# Patient Record
Sex: Female | Born: 1995 | Hispanic: Yes | Marital: Single | Smoking: Never smoker
Health system: Southern US, Community
[De-identification: ages and names within clinical notes are randomized; demographics above are authoritative.]

## PROBLEM LIST (undated history)

## (undated) HISTORY — PX: OTHER SURGICAL HISTORY: SHX169

## (undated) HISTORY — PX: TONSILLECTOMY: SUR1361

## (undated) HISTORY — PX: ARTHROSCOPIC REPAIR ACL: SUR80

---

## 2018-04-25 ENCOUNTER — Other Ambulatory Visit: Payer: Self-pay

## 2018-04-25 ENCOUNTER — Ambulatory Visit (INDEPENDENT_AMBULATORY_CARE_PROVIDER_SITE_OTHER): Payer: 59 | Admitting: Obstetrics and Gynecology

## 2018-04-25 ENCOUNTER — Other Ambulatory Visit (HOSPITAL_COMMUNITY)
Admission: RE | Admit: 2018-04-25 | Discharge: 2018-04-25 | Disposition: A | Discharge status: Home / Self Care | Payer: 59 | Source: Ambulatory Visit | Attending: Obstetrics and Gynecology | Admitting: Obstetrics and Gynecology

## 2018-04-25 ENCOUNTER — Encounter: Payer: Self-pay | Admitting: Obstetrics and Gynecology

## 2018-04-25 VITALS — BP 108/70 | HR 66 | Resp 16 | Ht 63.0 in | Wt 144.6 lb

## 2018-04-25 DIAGNOSIS — Z01419 Encounter for gynecological examination (general) (routine) without abnormal findings: Secondary | ICD-10-CM | POA: Diagnosis present

## 2018-04-25 DIAGNOSIS — N760 Acute vaginitis: Secondary | ICD-10-CM

## 2018-04-25 DIAGNOSIS — Z113 Encounter for screening for infections with a predominantly sexual mode of transmission: Secondary | ICD-10-CM

## 2018-04-25 NOTE — Progress Notes (Signed)
23 y.o. G0P0000 Single Hispanic female here for annual exam.    Patient complaining of unusual vaginal discharge--no vaginal irritation, itching or odor for 3 months. Tried probiotic. Took abx and steroids in October, 2019.  Did have a yeast infection in the past.   Last sexually active in summer last year.  Last STD screening in February, 2019.   Wants general blood work.  Wants to be a Armed forces operational officer.  PCP: None   Patient's last menstrual period was 04/07/2018 (exact date).     Period Cycle (Days): 30 Period Duration (Days): 5 days Period Pattern: Regular Menstrual Flow: Moderate Menstrual Control: Tampon, Maxi pad Menstrual Control Change Freq (Hours): every 3 hours on heaviest day Dysmenorrhea: None     Sexually active: No. female The current method of family planning is abstinence.    Exercising: Yes.    jogging/running Smoker:  no  Health Maintenance: Pap:  Never History of abnormal Pap:  n/a MMG:  n/a Colonoscopy:  n/a BMD:   n/a  Result  n/ TDaP:  2019 Gardasil:   Yes, completed x 3 HIV:04/2017 NR per patient Hep C: 04/2017 Neg per patient Screening Labs:  ----   reports that she has never smoked. She has never used smokeless tobacco. She reports previous alcohol use. She reports that she does not use drugs.  History reviewed. No pertinent past medical history.  Past Surgical History:  Procedure Laterality Date  . ARTHROSCOPIC REPAIR ACL Right   . TONSILLECTOMY     done at age 67 yo  . windom extractions     Oct. 2019    No current outpatient medications on file.   No current facility-administered medications for this visit.     Family History  Problem Relation Age of Onset  . Hypertension Mother   . Thyroid disease Mother        hyperthyroid  . Diabetes Maternal Grandmother   . Hyperlipidemia Maternal Grandmother     Review of Systems  All other systems reviewed and are negative.   Exam:   BP 108/70 (BP Location: Right Arm, Patient  Position: Sitting, Cuff Size: Normal)   Pulse 66   Resp 16   Ht 5\' 3"  (1.6 m)   Wt 144 lb 9.6 oz (65.6 kg)   LMP 04/07/2018 (Exact Date)   BMI 25.61 kg/m     General appearance: alert, cooperative and appears stated age Head: Normocephalic, without obvious abnormality, atraumatic Neck: no adenopathy, supple, symmetrical, trachea midline and thyroid normal to inspection and palpation Lungs: clear to auscultation bilaterally Breasts: normal appearance, no masses or tenderness, No nipple retraction or dimpling, No nipple discharge or bleeding, No axillary or supraclavicular adenopathy Heart: regular rate and rhythm Abdomen: soft, non-tender; no masses, no organomegaly Extremities: extremities normal, atraumatic, no cyanosis or edema Skin: Skin color, texture, turgor normal. No rashes or lesions Lymph nodes: Cervical, supraclavicular, and axillary nodes normal. No abnormal inguinal nodes palpated Neurologic: Grossly normal  Pelvic: External genitalia:  no lesions              Urethra:  normal appearing urethra with no masses, tenderness or lesions              Bartholins and Skenes: normal                 Vagina: normal appearing vagina with normal color and discharge, no lesions              Cervix: no lesions  Pap taken: Yes.   Bimanual Exam:  Uterus:  normal size, contour, position, consistency, mobility, non-tender              Adnexa: no mass, fullness, tenderness              Chaperone was present for exam.  Assessment:   Well woman visit with normal exam. Vaginitis.  STD screening.  Plan: Mammogram screening. Recommended self breast awareness. Pap and HR HPV as above. Guidelines for Calcium, Vitamin D, regular exercise program including cardiovascular and weight bearing exercise. Declines contraception.  Routine labs. STD screening.  Vaginitis screening.  Follow up annually and prn.   After visit summary provided.

## 2018-04-25 NOTE — Patient Instructions (Signed)

## 2018-04-26 LAB — HEP, RPR, HIV PANEL
HIV Screen 4th Generation wRfx: NONREACTIVE
Hepatitis B Surface Ag: NEGATIVE
RPR Ser Ql: NONREACTIVE

## 2018-04-26 LAB — CBC
Hematocrit: 40.4 % (ref 34.0–46.6)
Hemoglobin: 13.7 g/dL (ref 11.1–15.9)
MCH: 30.2 pg (ref 26.6–33.0)
MCHC: 33.9 g/dL (ref 31.5–35.7)
MCV: 89 fL (ref 79–97)
Platelets: 272 10*3/uL (ref 150–450)
RBC: 4.53 x10E6/uL (ref 3.77–5.28)
RDW: 12.2 % (ref 11.7–15.4)
WBC: 6.6 10*3/uL (ref 3.4–10.8)

## 2018-04-26 LAB — COMPREHENSIVE METABOLIC PANEL
ALT: 10 IU/L (ref 0–32)
AST: 15 IU/L (ref 0–40)
Albumin/Globulin Ratio: 2 (ref 1.2–2.2)
Albumin: 4.7 g/dL (ref 3.9–5.0)
Alkaline Phosphatase: 95 IU/L (ref 39–117)
BUN/Creatinine Ratio: 20 (ref 9–23)
BUN: 14 mg/dL (ref 6–20)
Bilirubin Total: 0.6 mg/dL (ref 0.0–1.2)
CO2: 23 mmol/L (ref 20–29)
Calcium: 9.3 mg/dL (ref 8.7–10.2)
Chloride: 102 mmol/L (ref 96–106)
Creatinine, Ser: 0.69 mg/dL (ref 0.57–1.00)
GFR calc Af Amer: 144 mL/min/{1.73_m2} (ref >59)
GFR calc non Af Amer: 125 mL/min/{1.73_m2} (ref >59)
Globulin, Total: 2.3 g/dL (ref 1.5–4.5)
Glucose: 88 mg/dL (ref 65–99)
POTASSIUM: 4.2 mmol/L (ref 3.5–5.2)
Sodium: 140 mmol/L (ref 134–144)
TOTAL PROTEIN: 7 g/dL (ref 6.0–8.5)

## 2018-04-26 LAB — HEPATITIS C ANTIBODY: Hep C Virus Ab: <0.1 s/co ratio (ref 0.0–0.9)

## 2018-04-26 LAB — LIPID PANEL
Chol/HDL Ratio: 2.1 ratio (ref 0.0–4.4)
Cholesterol, Total: 131 mg/dL (ref 100–199)
HDL: 62 mg/dL (ref >39)
LDL Calculated: 53 mg/dL (ref 0–99)
Triglycerides: 79 mg/dL (ref 0–149)
VLDL CHOLESTEROL CAL: 16 mg/dL (ref 5–40)

## 2018-04-28 LAB — CYTOLOGY - PAP: Diagnosis: NEGATIVE

## 2018-04-29 LAB — NUSWAB VAGINITIS PLUS (VG+)
Candida albicans, NAA: NEGATIVE
Candida glabrata, NAA: NEGATIVE
Chlamydia trachomatis, NAA: NEGATIVE
Neisseria gonorrhoeae, NAA: NEGATIVE
Trich vag by NAA: NEGATIVE

## 2018-11-20 ENCOUNTER — Other Ambulatory Visit: Payer: Self-pay

## 2018-11-20 DIAGNOSIS — Z20822 Contact with and (suspected) exposure to covid-19: Secondary | ICD-10-CM

## 2018-11-22 LAB — NOVEL CORONAVIRUS, NAA: SARS-CoV-2, NAA: NOT DETECTED

## 2019-03-17 ENCOUNTER — Telehealth: Payer: Self-pay | Admitting: Obstetrics and Gynecology

## 2019-03-17 NOTE — Telephone Encounter (Signed)
Left message on voicemail to call and reschedule cancelled appointment. °

## 2019-05-01 ENCOUNTER — Ambulatory Visit: Payer: 59 | Admitting: Obstetrics and Gynecology

## 2019-08-24 ENCOUNTER — Telehealth: Payer: Self-pay | Admitting: Obstetrics and Gynecology

## 2019-08-24 NOTE — Telephone Encounter (Signed)
AEX 04/2018, next scheduled for 11/2019 with Dr Edward Jolly Normal breast exam on last AEX. No family or personal H/O breast cancer  Spoke with pt. Pt states having intermittent sharp, shooting, burning sensation in left breast. Pt states goes from left breast to midline then under breast x 4 days now. Pt denies any breast skin changes, nipple discharge, fever, chills, breast surgery to area or lumps. Pt states has not had Covid vaccine series. Pt states only drinking 1 cup of coffee per day and has not recently increased caffeine. Pt's LMP was 6/8-6/13 and was normal with normal flow. Pt denies any previous MMG.   Advised pt to have OV for further evaluation. Pt offered several earlier appts, but declines due to work schedule. Pt scheduled for 6/28 at 1130 am with Dr Edward Jolly. Pt agreeable and verbalizes understanding of date and time of appt. Pt advised to return call to office if symptoms worsen to be seen earlier. Pt agreeable.  Routing to Dr Edward Jolly for review.  Encounter closed.

## 2019-08-24 NOTE — Telephone Encounter (Signed)
Patient is having "sharp breast pain."

## 2019-08-27 NOTE — Progress Notes (Signed)
GYNECOLOGY  VISIT   HPI: 24 y.o.   Single  Hispanic  female   G0P0000 with Patient's last menstrual period was 08/11/2019 (exact date).   here for left breast pain x1week.   It is a burning pain that comes and goes.  Nothing makes it better or worse. She does not feel any lump.  She has not had her Covid vaccine.   She is doing some weight lifting, starting 1 month ago.   No injuries.   No FH of breast cancer.   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   GYNECOLOGIC HISTORY: Patient's last menstrual period was 08/11/2019 (exact date). Contraception: none Menopausal hormone therapy:  none Last mammogram:  n/a Last pap smear: 04-25-18 Neg        OB History    Gravida  0   Para  0   Term  0   Preterm  0   AB  0   Living  0     SAB  0   TAB  0   Ectopic  0   Multiple  0   Live Births  0              There are no problems to display for this patient.   No past medical history on file.  Past Surgical History:  Procedure Laterality Date  . ARTHROSCOPIC REPAIR ACL Right   . TONSILLECTOMY     done at age 89 yo  . windom extractions     Oct. 2019    No current outpatient medications on file.   No current facility-administered medications for this visit.     ALLERGIES: Latex  Family History  Problem Relation Age of Onset  . Hypertension Mother   . Thyroid disease Mother        hyperthyroid  . Diabetes Maternal Grandmother   . Hyperlipidemia Maternal Grandmother     Social History   Socioeconomic History  . Marital status: Single    Spouse name: Not on file  . Number of children: Not on file  . Years of education: Not on file  . Highest education level: Not on file  Occupational History  . Not on file  Tobacco Use  . Smoking status: Never Smoker  . Smokeless tobacco: Never Used  Vaping Use  . Vaping Use: Never used  Substance and Sexual Activity  . Alcohol use: Not Currently  . Drug use: Never  . Sexual activity: Not Currently     Birth control/protection: Abstinence  Other Topics Concern  . Not on file  Social History Narrative  . Not on file   Social Determinants of Health   Financial Resource Strain:   . Difficulty of Paying Living Expenses:   Food Insecurity:   . Worried About Charity fundraiser in the Last Year:   . Arboriculturist in the Last Year:   Transportation Needs:   . Film/video editor (Medical):   Marland Kitchen Lack of Transportation (Non-Medical):   Physical Activity:   . Days of Exercise per Week:   . Minutes of Exercise per Session:   Stress:   . Feeling of Stress :   Social Connections:   . Frequency of Communication with Friends and Family:   . Frequency of Social Gatherings with Friends and Family:   . Attends Religious Services:   . Active Member of Clubs or Organizations:   . Attends Archivist Meetings:   Marland Kitchen Marital Status:   Intimate Production manager  Violence:   . Fear of Current or Ex-Partner:   . Emotionally Abused:   Marland Kitchen Physically Abused:   . Sexually Abused:     Review of Systems  All other systems reviewed and are negative.   PHYSICAL EXAMINATION:    BP 98/62   Pulse 60   Temp (!) 97.2 F (36.2 C) (Temporal)   Ht 5\' 3"  (1.6 m)   Wt 146 lb (66.2 kg)   LMP 08/11/2019 (Exact Date)   BMI 25.86 kg/m     General appearance: alert, cooperative and appears stated age Head: Normocephalic, without obvious abnormality, atraumatic Breasts: normal appearance, no masses or tenderness, No nipple retraction or dimpling, No nipple discharge or bleeding, No axillary adenopathy   Chaperone was present for exam.  ASSESSMENT  Left breast pain.  Probable musculoskeletal strain.   PLAN  Stop weight lifting temporarily.  Ibuprofen 600 mg po q 6 hours prn.  Try heat.  Return for continued pain or if a mass is felt, would then do breast 10/11/2019.  Return also for annual exam.

## 2019-08-31 ENCOUNTER — Ambulatory Visit (INDEPENDENT_AMBULATORY_CARE_PROVIDER_SITE_OTHER): Payer: BC Managed Care – PPO | Admitting: Obstetrics and Gynecology

## 2019-08-31 ENCOUNTER — Encounter: Payer: Self-pay | Admitting: Obstetrics and Gynecology

## 2019-08-31 ENCOUNTER — Other Ambulatory Visit: Payer: Self-pay

## 2019-08-31 VITALS — BP 98/62 | HR 60 | Temp 97.2°F | Ht 63.0 in | Wt 146.0 lb

## 2019-08-31 DIAGNOSIS — N644 Mastodynia: Secondary | ICD-10-CM | POA: Diagnosis not present

## 2019-10-26 ENCOUNTER — Telehealth: Payer: Self-pay

## 2019-10-26 NOTE — Telephone Encounter (Signed)
Spoke with patient. Patient reports 2-3 bumps appeared on "hood of inner labia" on 8/21. She applied OTC hydrocortisone cream and the bumps have since resolved. Denies any other GYN symptoms. Requesting OV for evaluation.   OV scheduled for 8/24 at 10:30am with Dr. Edward Jolly  Last AEX 04/26/19 Next AEX 12/03/19  Patient is agreeable to date and time.   Encounter closed.

## 2019-10-26 NOTE — Telephone Encounter (Signed)
Patient is calling in regards to having a rash in vaginal area.

## 2019-10-26 NOTE — Progress Notes (Signed)
GYNECOLOGY  VISIT   HPI: 24 y.o.   Single  Hispanic  female   G0P0000 with Patient's last menstrual period was 10/15/2019.   here for vaginal bumps last week which seem to be gone today. She felt soreness near the labia 4 days ago.  She used cortisone cream, and this resolved the lumps completely.  They were white/yellow and they were sore.  She took a picture.   She had some headache also.  No fever, shakes, chills.   New same sex partner for the last 6 months.  No condom use.  Denies hx of HSV II for patient and partner.  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   Using new pads, new underwear, new detergent.   GYNECOLOGIC HISTORY: Patient's last menstrual period was 10/15/2019. Contraception:  none Menopausal hormone therapy:  n/a Last mammogram: n/a Last pap smear:  04-25-18 Neg        OB History    Gravida  0   Para  0   Term  0   Preterm  0   AB  0   Living  0     SAB  0   TAB  0   Ectopic  0   Multiple  0   Live Births  0              There are no problems to display for this patient.   History reviewed. No pertinent past medical history.  Past Surgical History:  Procedure Laterality Date  . ARTHROSCOPIC REPAIR ACL Right   . TONSILLECTOMY     done at age 57 yo  . windom extractions     Oct. 2019    No current outpatient medications on file.   No current facility-administered medications for this visit.     ALLERGIES: Latex  Family History  Problem Relation Age of Onset  . Hypertension Mother   . Thyroid disease Mother        hyperthyroid  . Diabetes Maternal Grandmother   . Hyperlipidemia Maternal Grandmother     Social History   Socioeconomic History  . Marital status: Single    Spouse name: Not on file  . Number of children: Not on file  . Years of education: Not on file  . Highest education level: Not on file  Occupational History  . Not on file  Tobacco Use  . Smoking status: Never Smoker  .  Smokeless tobacco: Never Used  Vaping Use  . Vaping Use: Never used  Substance and Sexual Activity  . Alcohol use: Not Currently  . Drug use: Never  . Sexual activity: Not Currently    Birth control/protection: Abstinence  Other Topics Concern  . Not on file  Social History Narrative  . Not on file   Social Determinants of Health   Financial Resource Strain:   . Difficulty of Paying Living Expenses: Not on file  Food Insecurity:   . Worried About Programme researcher, broadcasting/film/video in the Last Year: Not on file  . Ran Out of Food in the Last Year: Not on file  Transportation Needs:   . Lack of Transportation (Medical): Not on file  . Lack of Transportation (Non-Medical): Not on file  Physical Activity:   . Days of Exercise per Week: Not on file  . Minutes of Exercise per Session: Not on file  Stress:   . Feeling of Stress : Not on file  Social Connections:   . Frequency of  Communication with Friends and Family: Not on file  . Frequency of Social Gatherings with Friends and Family: Not on file  . Attends Religious Services: Not on file  . Active Member of Clubs or Organizations: Not on file  . Attends Banker Meetings: Not on file  . Marital Status: Not on file  Intimate Partner Violence:   . Fear of Current or Ex-Partner: Not on file  . Emotionally Abused: Not on file  . Physically Abused: Not on file  . Sexually Abused: Not on file    Review of Systems  All other systems reviewed and are negative.   PHYSICAL EXAMINATION:    BP 116/60   Pulse 68   Ht 5\' 3"  (1.6 m)   Wt 147 lb (66.7 kg)   LMP 10/15/2019   BMI 26.04 kg/m     General appearance: alert, cooperative and appears stated age  Pelvic: External genitalia:  no lesions              Urethra:  normal appearing urethra with no masses, tenderness or lesions              Bartholins and Skenes: normal                 Vagina: normal appearing vagina with normal color and discharge, no lesions               Cervix: no lesions                Bimanual Exam:  Uterus:  normal size, contour, position, consistency, mobility, non-tender              Adnexa: no mass, fullness, tenderness    Chaperone was present for exam.  ASSESSMENT  STD screening.   Vulvar lesions resolved.  Hx HSV I.   PLAN  We discussed HSV I and II, routines of transmission, symptoms, asymptomatic shedding, and antiviral therapy. STD screening.  Condoms use reviewed.

## 2019-10-27 ENCOUNTER — Ambulatory Visit (INDEPENDENT_AMBULATORY_CARE_PROVIDER_SITE_OTHER): Payer: BC Managed Care – PPO | Admitting: Obstetrics and Gynecology

## 2019-10-27 ENCOUNTER — Other Ambulatory Visit (HOSPITAL_COMMUNITY)
Admission: RE | Admit: 2019-10-27 | Discharge: 2019-10-27 | Disposition: A | Discharge status: Home / Self Care | Payer: BC Managed Care – PPO | Source: Ambulatory Visit | Attending: Obstetrics and Gynecology | Admitting: Obstetrics and Gynecology

## 2019-10-27 ENCOUNTER — Other Ambulatory Visit: Payer: Self-pay

## 2019-10-27 ENCOUNTER — Encounter: Payer: Self-pay | Admitting: Obstetrics and Gynecology

## 2019-10-27 VITALS — BP 116/60 | HR 68 | Ht 63.0 in | Wt 147.0 lb

## 2019-10-27 DIAGNOSIS — Z113 Encounter for screening for infections with a predominantly sexual mode of transmission: Secondary | ICD-10-CM

## 2019-10-28 LAB — CERVICOVAGINAL ANCILLARY ONLY
Chlamydia: NEGATIVE
Comment: NEGATIVE
Comment: NEGATIVE
Comment: NORMAL
Neisseria Gonorrhea: NEGATIVE
Trichomonas: NEGATIVE

## 2019-10-28 LAB — HSV(HERPES SIMPLEX VRS) I + II AB-IGG
HSV 1 Glycoprotein G Ab, IgG: <0.91 index (ref 0.00–0.90)
HSV 2 IgG, Type Spec: <0.91 index (ref 0.00–0.90)

## 2019-10-28 LAB — HEP, RPR, HIV PANEL
HIV Screen 4th Generation wRfx: NONREACTIVE
Hepatitis B Surface Ag: NEGATIVE
RPR Ser Ql: NONREACTIVE

## 2019-10-28 LAB — HEPATITIS C ANTIBODY: Hep C Virus Ab: <0.1 s/co ratio (ref 0.0–0.9)

## 2019-12-03 ENCOUNTER — Ambulatory Visit: Payer: Self-pay | Admitting: Obstetrics and Gynecology

## 2020-09-13 ENCOUNTER — Encounter: Payer: Self-pay | Admitting: Obstetrics and Gynecology

## 2020-09-15 NOTE — Progress Notes (Signed)
GYNECOLOGY  VISIT   HPI: 25 y.o.   Single  Hispanic  female   G0P0000 with Patient's last menstrual period was 08/27/2020 (exact date).   here for breast discomfort bilaterally x3 weeks. She states it is worse in left breast. She denies any nipple discharge or any masses.  Patient was seen one year ago for the same pain.   Decreasing her working out.   Not taking ibuprofen.   It can hurt prior to and after her period.   No breast trauma.   Eliminating caffeine.   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  GYNECOLOGIC HISTORY: Patient's last menstrual period was 08/27/2020 (exact date). Contraception:  Abstinence Menopausal hormone therapy:  n/a Last mammogram:  n/a Last pap smear: 04-25-18 Neg        OB History     Gravida  0   Para  0   Term  0   Preterm  0   AB  0   Living  0      SAB  0   IAB  0   Ectopic  0   Multiple  0   Live Births  0              There are no problems to display for this patient.   History reviewed. No pertinent past medical history.  Past Surgical History:  Procedure Laterality Date   ARTHROSCOPIC REPAIR ACL Right    TONSILLECTOMY     done at age 43 yo   windom extractions     Oct. 2019    No current outpatient medications on file.   No current facility-administered medications for this visit.     ALLERGIES: Latex  Family History  Problem Relation Age of Onset   Hypertension Mother    Thyroid disease Mother        hyperthyroid   Diabetes Maternal Grandmother    Hyperlipidemia Maternal Grandmother     Social History   Socioeconomic History   Marital status: Single    Spouse name: Not on file   Number of children: Not on file   Years of education: Not on file   Highest education level: Not on file  Occupational History   Not on file  Tobacco Use   Smoking status: Never   Smokeless tobacco: Never  Vaping Use   Vaping Use: Never used  Substance and Sexual Activity   Alcohol use: Not Currently   Drug use:  Never   Sexual activity: Not Currently    Birth control/protection: Abstinence  Other Topics Concern   Not on file  Social History Narrative   Not on file   Social Determinants of Health   Financial Resource Strain: Not on file  Food Insecurity: Not on file  Transportation Needs: Not on file  Physical Activity: Not on file  Stress: Not on file  Social Connections: Not on file  Intimate Partner Violence: Not on file    Review of Systems  Musculoskeletal:        Breast pain bilaterally   All other systems reviewed and are negative.  PHYSICAL EXAMINATION:    BP 110/64   LMP 08/27/2020 (Exact Date)     General appearance: alert, cooperative and appears stated age Head: Normocephalic, without obvious abnormality, atraumatic Breasts: right - normal appearance, no masses or tenderness, No nipple retraction or dimpling, No nipple discharge or bleeding, No axillary or supraclavicular adenopathy Left - normal appearance, no masses, tenderness present left lateral breast, No nipple retraction or  dimpling, No nipple discharge or bleeding, No axillary or supraclavicular adenopathy Lymph nodes: Cervical, supraclavicular, and axillary nodes normal.  Chaperone was present for exam.  ASSESSMENT  Left lateral breast pain: chronic. No breast mass on exam today.  PLAN  Ibuprofen 800 mg every 8 hour prn.  She may also try oral vitamin E.  Try local heat or an ice pack.  She is already eliminating caffeine.  Will proceed with left breast ultrasound.   An After Visit Summary was printed and given to the patient.

## 2020-09-20 ENCOUNTER — Encounter: Payer: Self-pay | Admitting: Obstetrics and Gynecology

## 2020-09-20 ENCOUNTER — Ambulatory Visit: Payer: BC Managed Care – PPO | Admitting: Obstetrics and Gynecology

## 2020-09-20 ENCOUNTER — Other Ambulatory Visit: Payer: Self-pay

## 2020-09-20 VITALS — BP 110/64

## 2020-09-20 DIAGNOSIS — N644 Mastodynia: Secondary | ICD-10-CM

## 2020-09-20 MED ORDER — IBUPROFEN 800 MG PO TABS: 800.0000 mg | ORAL_TABLET | Freq: Three times a day (TID) | ORAL | 0 refills | Status: AC | PRN

## 2020-09-20 NOTE — Patient Instructions (Signed)
Breast Tenderness Breast tenderness is a common problem for women of all ages, but may also occur in men. Breast tenderness may range from mild discomfort to severe pain. In women, the pain usually comes and goes with the menstrual cycle, but it can also be constant. Breast tenderness has many possible causes, including hormone changes, infections, and taking certain medicines. You may have tests, such as a mammogram or an ultrasound, to check for any unusual findings. Having breast tenderness usually does not mean that you have breast cancer. Follow these instructions at home: Managing pain and discomfort  If directed, put ice to the painful area. To do this: Put ice in a plastic bag. Place a towel between your skin and the bag. Leave the ice on for 20 minutes, 2-3 times a day. Wear a supportive bra, especially during exercise. You may also want to wear a supportive bra while sleeping if your breasts are very tender. Medicines Take over-the-counter and prescription medicines only as told by your health care provider. If the cause of your pain is infection, you may be prescribed an antibiotic medicine. If you were prescribed an antibiotic, take it as told by your health care provider. Do not stop taking the antibiotic even if you start to feel better. Eating and drinking Your health care provider may recommend that you lessen the amount of fat in your diet. You can do this by: Limiting fried foods. Cooking foods using methods such as baking, boiling, grilling, and broiling. Decrease the amount of caffeine in your diet. Instead, drink more water and choose caffeine-free drinks. General instructions  Keep a log of the days and times when your breasts are most tender. Ask your health care provider how to do breast exams at home. This will help you notice if you have an unusual growth or lump. Keep all follow-up visits as told by your health care provider. This is important. Contact a health care  provider if: Any part of your breast is hard, red, and hot to the touch. This may be a sign of infection. You are a woman and: Not breastfeeding and you have fluid, especially blood or pus, coming out of your nipples. Have a new or painful lump in your breast that remains after your menstrual period ends. You have a fever. Your pain does not improve or it gets worse. Your pain is interfering with your daily activities. Summary Breast tenderness may range from mild discomfort to severe pain. Breast tenderness has many possible causes, including hormone changes, infections, and taking certain medicines. It can be treated with ice, wearing a supportive bra, and medicines. Make changes to your diet if told to by your health care provider. This information is not intended to replace advice given to you by your health care provider. Make sure you discuss any questions you have with your health care provider. Document Revised: 07/14/2018 Document Reviewed: 07/14/2018 Elsevier Patient Education  2022 [employer]  

## 2020-09-24 ENCOUNTER — Telehealth: Payer: Self-pay | Admitting: Obstetrics and Gynecology

## 2020-09-24 DIAGNOSIS — N644 Mastodynia: Secondary | ICD-10-CM

## 2020-09-24 NOTE — Telephone Encounter (Signed)
Please schedule a left breast ultrasound at [facility] for chronic left breast pain.   Her pain is in the left lateral breast.  No mass on examination.

## 2020-09-26 NOTE — Telephone Encounter (Signed)
Patient scheduled at the breast center on 10/13/20 @ 12:40pm

## 2020-09-30 ENCOUNTER — Ambulatory Visit
Admission: RE | Admit: 2020-09-30 | Discharge: 2020-09-30 | Disposition: A | Discharge status: Home / Self Care | Payer: BC Managed Care – PPO | Source: Ambulatory Visit | Attending: Obstetrics and Gynecology | Admitting: Obstetrics and Gynecology

## 2020-09-30 ENCOUNTER — Other Ambulatory Visit: Payer: Self-pay | Admitting: Obstetrics and Gynecology

## 2020-09-30 ENCOUNTER — Other Ambulatory Visit: Payer: Self-pay

## 2020-09-30 DIAGNOSIS — N6320 Unspecified lump in the left breast, unspecified quadrant: Secondary | ICD-10-CM

## 2020-09-30 DIAGNOSIS — N644 Mastodynia: Secondary | ICD-10-CM

## 2020-10-03 ENCOUNTER — Encounter: Payer: Self-pay | Admitting: Obstetrics and Gynecology

## 2020-10-04 NOTE — Telephone Encounter (Signed)
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   She may request breast biopsy, which would result in three biopsies, one for each area detected.   I am happy to place an order for this if this is what she is requesting.  It may give her peace of mind.   She has had chronic pain in the left lateral breast.

## 2020-10-05 NOTE — Telephone Encounter (Signed)
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   Patient is requesting this procedure.

## 2020-10-12 ENCOUNTER — Telehealth: Payer: Self-pay | Admitting: Obstetrics and Gynecology

## 2020-10-12 ENCOUNTER — Other Ambulatory Visit: Payer: Self-pay | Admitting: Obstetrics and Gynecology

## 2020-10-12 DIAGNOSIS — N63 Unspecified lump in unspecified breast: Secondary | ICD-10-CM

## 2020-10-12 NOTE — Telephone Encounter (Signed)
Phone call with Dr. Linde Gillis of the [facility] regarding request to proceed with breast biopsies.   I confirmed the patient's request to proceed forward based on persistent left breast pain and the recent findings on imaging.

## 2020-10-13 ENCOUNTER — Other Ambulatory Visit: Payer: BC Managed Care – PPO

## 2020-10-13 NOTE — Telephone Encounter (Signed)
I called and left message for Essence to call me.

## 2020-10-25 ENCOUNTER — Other Ambulatory Visit: Payer: BC Managed Care – PPO

## 2020-11-15 ENCOUNTER — Telehealth: Payer: Self-pay

## 2020-11-15 NOTE — Telephone Encounter (Signed)
Patient is currently in Mammogram hold.  She did follow up with [facility] on 10/31/20 for 2nd opinion and result and recommendation are in her Epic chart. " ASSESSMENT: BI-RADS Category: 3-US51mo : Probably Benign. Short-interval follow-up. Ultrasound in 6 months.  Recommendation Laterality: Left   MANAGEMENT RECOMMENDATIONS: Recommend continued clinical management of the left breast pain with follow up left breast ultrasound at a 6 month interval to monitor stability of the 3 masses. Recommend returning sooner for targeted imaging based on physical exam findings.   I updated mammo hold for follow up 04/2021.

## 2020-11-15 NOTE — Telephone Encounter (Signed)
Encounter reviewed and closed.  

## 2021-04-07 ENCOUNTER — Other Ambulatory Visit: Payer: BC Managed Care – PPO

## 2021-07-31 ENCOUNTER — Telehealth: Payer: Self-pay | Admitting: Obstetrics and Gynecology

## 2021-07-31 NOTE — Telephone Encounter (Signed)
Please remove from all mammogram hold.  Patient is being seen at the [facility].  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   Please close this encounter.

## 2021-08-01 NOTE — Telephone Encounter (Signed)
Encounter reviewed and closed.  

## 2021-08-01 NOTE — Telephone Encounter (Signed)
Removed from mammo hold. 

## 2021-09-28 ENCOUNTER — Ambulatory Visit: Payer: Medicaid Other | Admitting: Obstetrics and Gynecology

## 2022-07-02 IMAGING — US US BREAST*L* LIMITED INC AXILLA
1 series · 12 of 20 positions shown · non-contrast
Comparison: None.
COMPARISON: None.

Addendum:
CLINICAL DATA: 25-year-old female describes LEFT breast pain.

EXAM:
ULTRASOUND OF THE LEFT BREAST

[Series 1: us breast*left* limited inc axilla · 0.07mm/px · 12 of 20 slices shown]
[im 1/20]
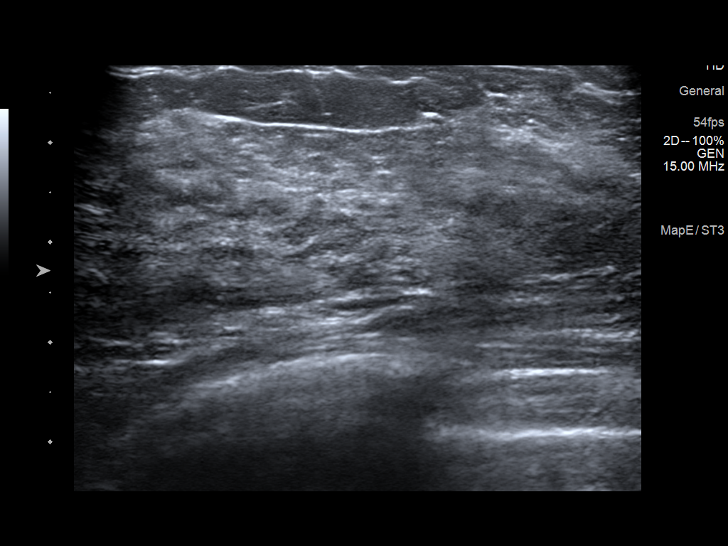
[im 3/20]
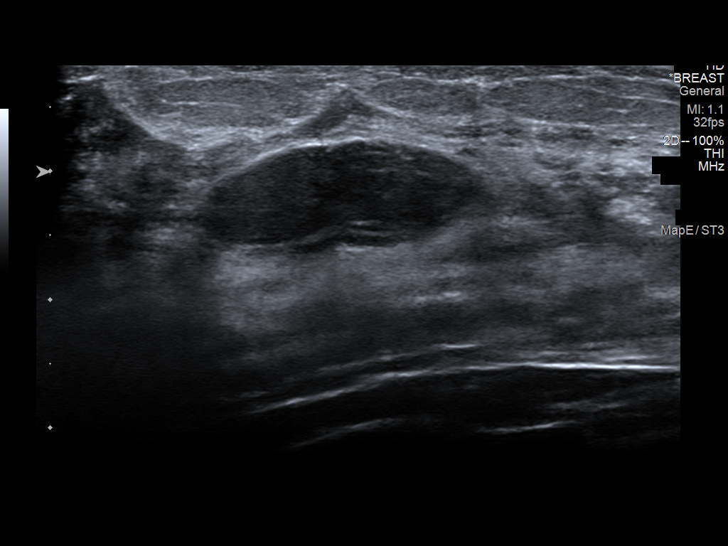
[im 5/20]
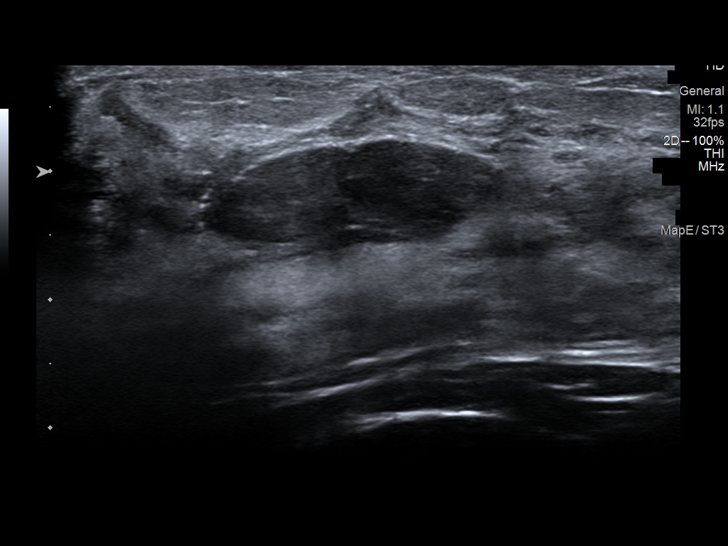
[im 6/20]
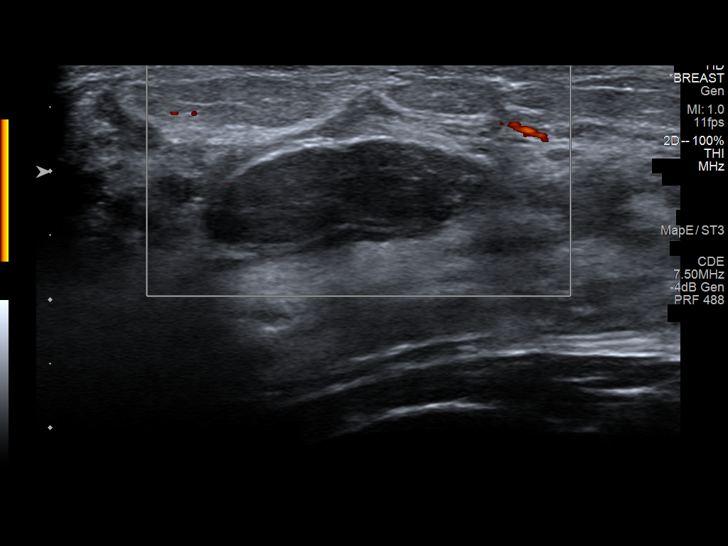
[im 8/20]
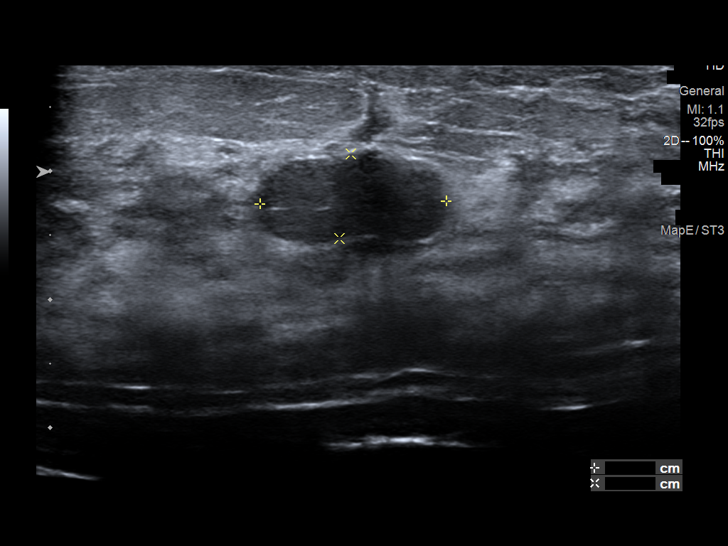
[im 10/20]
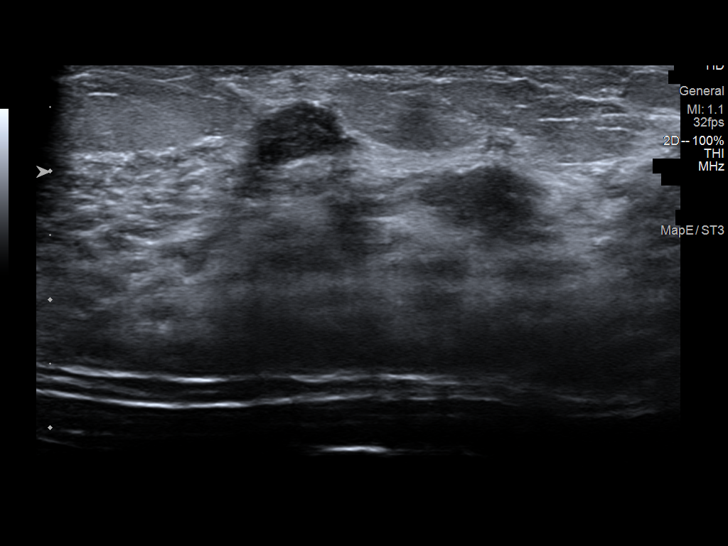
[im 11/20]
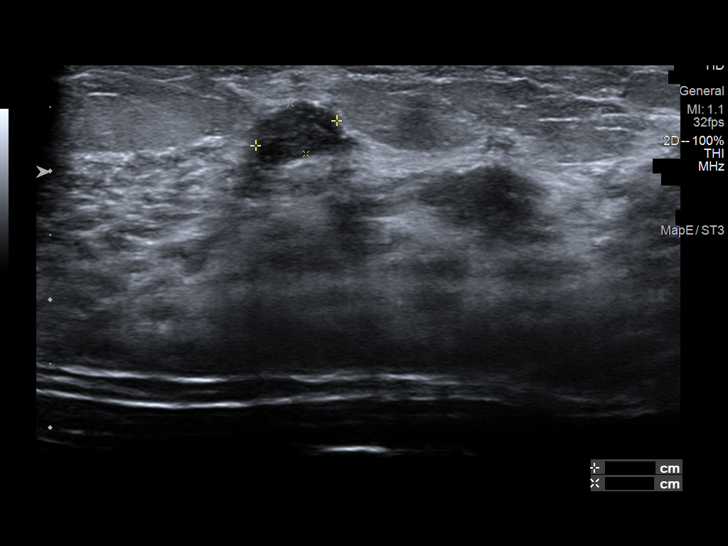
[im 13/20]
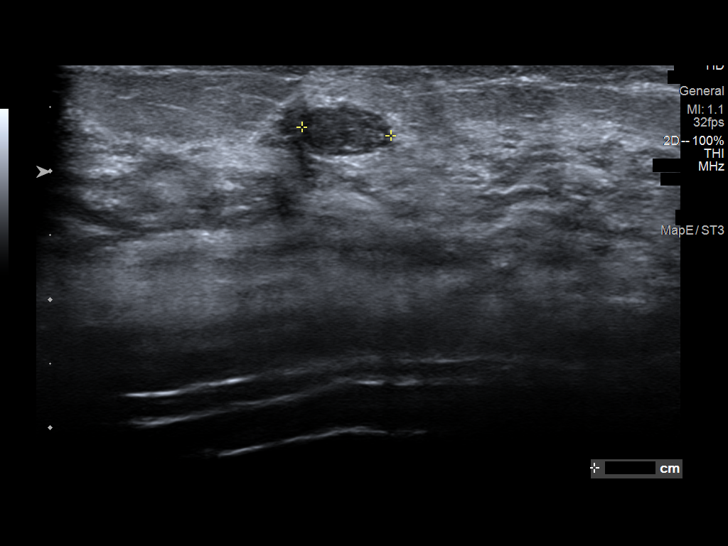
[im 15/20]
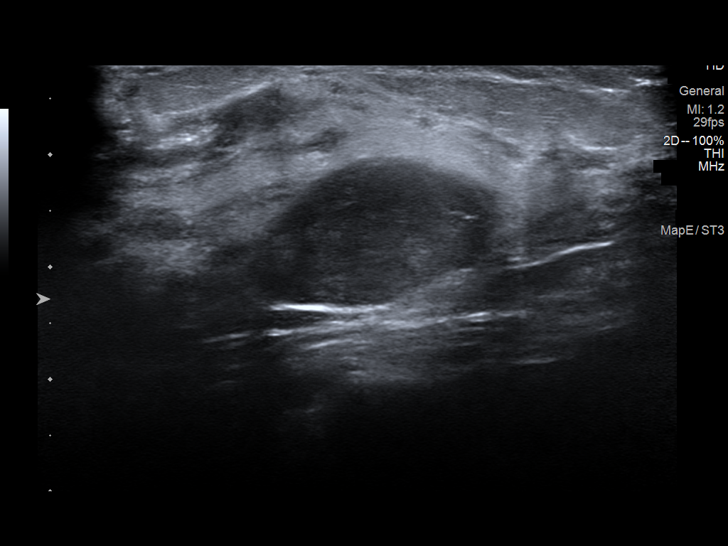
[im 16/20]
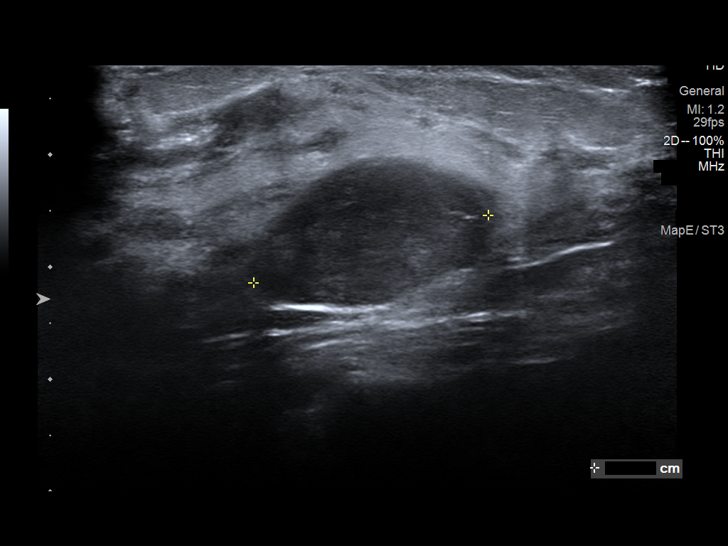
[im 18/20]
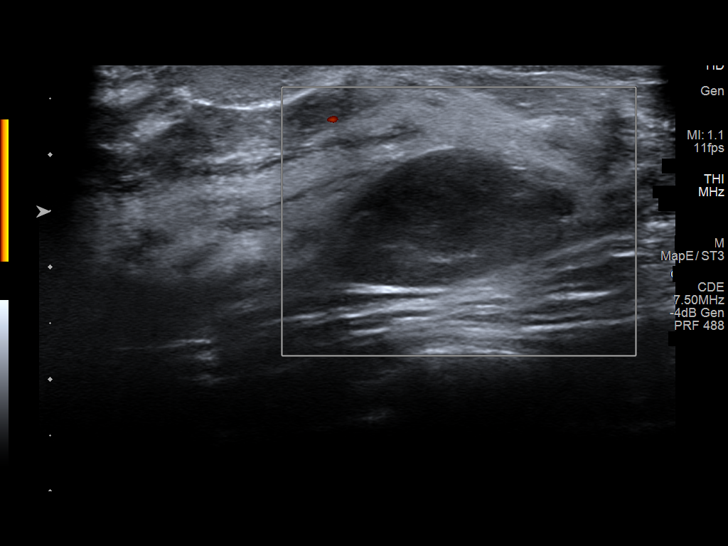
[im 20/20]
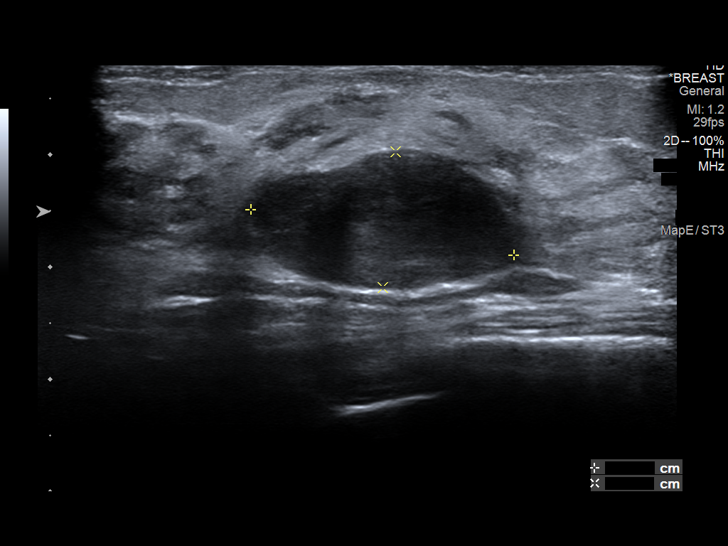

[12 of 20 positions shown; findings below may reference images not displayed]

FINDINGS: Targeted ultrasound is performed, evaluating the outer LEFT breast
with particular attention to the 2-3 o'clock axis as directed by the
patient, showing multiple oval circumscribed hypoechoic masses as
follows:

LEFT breast, 3 o'clock axis, 3 cm from the nipple, measuring 2.2 x
0.7 x 1.5 cm.

LEFT breast, 2:30 o'clock axis, 3 cm from the nipple, measuring
x 0.4 x 0.7 cm.

LEFT breast, 5 o'clock axis, 3 cm from the nipple, measuring 2.4 x
1.2 x 2.2 cm.

At the 2-3 o'clock axis, 4 cm from the nipple, also corresponding to
the area of patient's pain, there are only normal dense
fibroglandular tissues.
IMPRESSION: 1. Multiple oval circumscribed masses within the outer LEFT breast,
with locations and measurements detailed above, most likely multiple
benign fibroadenomas. These masses are most likely incidental and
unrelated to patient's history of breast pain. Recommend follow-up
LEFT breast ultrasound in 6 months to ensure stability.
2. No suspicious solid or cystic mass is identified by ultrasound.

RECOMMENDATION:
1. LEFT breast ultrasound in 6 months.
2. Benign causes of breast pain, and possible remedies, were
discussed with the patient. Patient was encouraged to follow-up with
referring physician if the pain persisted or worsened.

I have discussed the findings and recommendations with the patient.
If applicable, a reminder letter will be sent to the patient
regarding the next appointment.

BI-RADS CATEGORY  3: Probably benign.

ADDENDUM:
Patient now desires ultrasound-guided biopsy of all 3 masses in the
LEFT breast to ensure benignity. This was confirmed with the
ordering physician on 10/12/2020 at [DATE] a.m.

*** End of Addendum ***
FINDINGS: Targeted ultrasound is performed, evaluating the outer LEFT breast
with particular attention to the 2-3 o'clock axis as directed by the
patient, showing multiple oval circumscribed hypoechoic masses as
follows:

LEFT breast, 3 o'clock axis, 3 cm from the nipple, measuring 2.2 x
0.7 x 1.5 cm.

LEFT breast, 2:30 o'clock axis, 3 cm from the nipple, measuring
x 0.4 x 0.7 cm.

LEFT breast, 5 o'clock axis, 3 cm from the nipple, measuring 2.4 x
1.2 x 2.2 cm.

At the 2-3 o'clock axis, 4 cm from the nipple, also corresponding to
the area of patient's pain, there are only normal dense
fibroglandular tissues.
IMPRESSION: 1. Multiple oval circumscribed masses within the outer LEFT breast,
with locations and measurements detailed above, most likely multiple
benign fibroadenomas. These masses are most likely incidental and
unrelated to patient's history of breast pain. Recommend follow-up
LEFT breast ultrasound in 6 months to ensure stability.
2. No suspicious solid or cystic mass is identified by ultrasound.

RECOMMENDATION:
1. LEFT breast ultrasound in 6 months.
2. Benign causes of breast pain, and possible remedies, were
discussed with the patient. Patient was encouraged to follow-up with
referring physician if the pain persisted or worsened.

I have discussed the findings and recommendations with the patient.
If applicable, a reminder letter will be sent to the patient
regarding the next appointment.

BI-RADS CATEGORY  3: Probably benign.
